# Patient Record
Sex: Female | Born: 1975 | Race: White | Hispanic: No | Marital: Single | Smoking: Current every day smoker
Health system: Southern US, Community
[De-identification: ages and names within clinical notes are randomized; demographics above are authoritative.]

## PROBLEM LIST (undated history)

## (undated) DIAGNOSIS — B1920 Unspecified viral hepatitis C without hepatic coma: Secondary | ICD-10-CM

## (undated) DIAGNOSIS — F1910 Other psychoactive substance abuse, uncomplicated: Secondary | ICD-10-CM

## (undated) DIAGNOSIS — F419 Anxiety disorder, unspecified: Secondary | ICD-10-CM

---

## 2005-01-28 ENCOUNTER — Emergency Department (HOSPITAL_COMMUNITY): Admission: EM | Admit: 2005-01-28 | Discharge: 2005-01-28 | Payer: Self-pay | Admitting: Emergency Medicine

## 2005-01-30 ENCOUNTER — Emergency Department (HOSPITAL_COMMUNITY): Admission: EM | Admit: 2005-01-30 | Discharge: 2005-01-30 | Payer: Self-pay | Admitting: Emergency Medicine

## 2005-02-12 ENCOUNTER — Emergency Department (HOSPITAL_COMMUNITY): Admission: EM | Admit: 2005-02-12 | Discharge: 2005-02-12 | Payer: Self-pay | Admitting: Emergency Medicine

## 2005-05-29 ENCOUNTER — Emergency Department (HOSPITAL_COMMUNITY): Admission: EM | Admit: 2005-05-29 | Discharge: 2005-05-29 | Payer: Self-pay | Admitting: Emergency Medicine

## 2007-03-17 ENCOUNTER — Emergency Department: Payer: Self-pay | Admitting: Emergency Medicine

## 2009-07-10 IMAGING — CT CT ABD-PELV W/ CM
1 of 2 series · 14 of 32 positions shown, 18 images · non-contrast
Comparison: none

REASON FOR EXAM: (1) RLQ  pain; (2) RLQ  pain
COMMENTS:

[Series 2: appendicitis · axial · 0.64mm/px · z∈[+151,+526]mm · 14 of 143 slices shown, 18 images]
[im 12/143  soft-tissue]
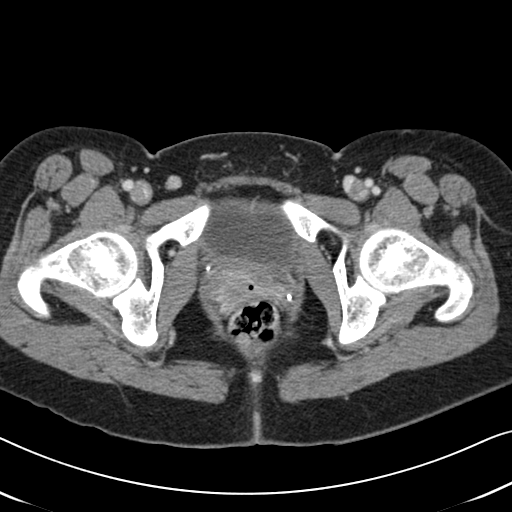
[im 12/143  bone]
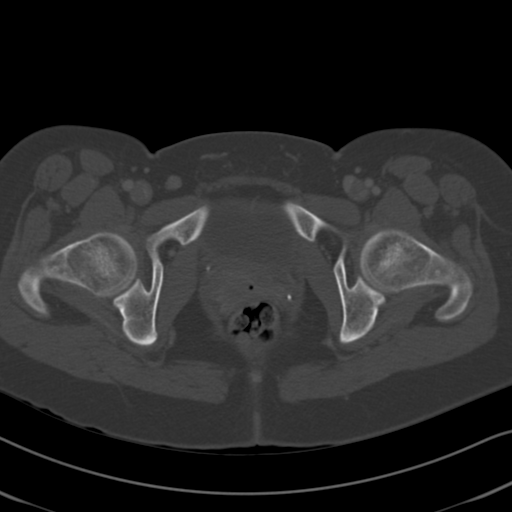
[im 23/143  soft-tissue]
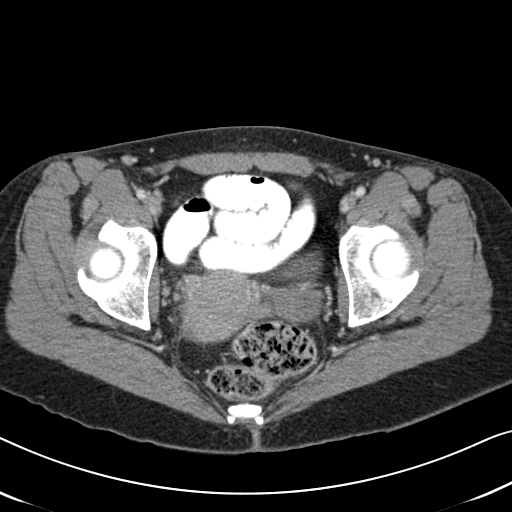
[im 35/143  soft-tissue]
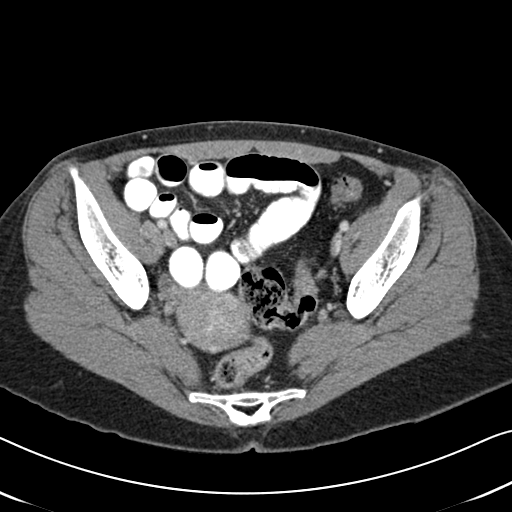
[im 46/143  soft-tissue]
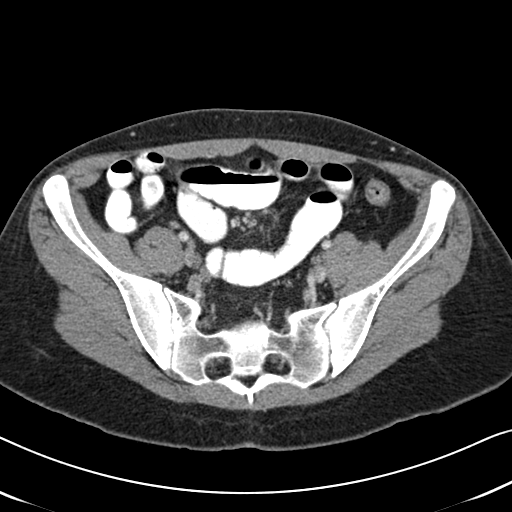
[im 57/143  soft-tissue]
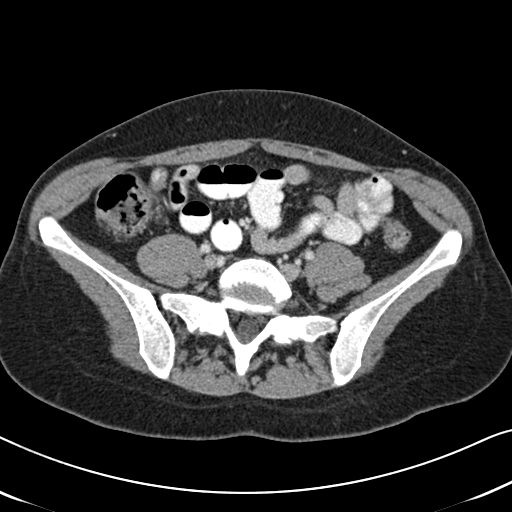
[im 69/143  soft-tissue]
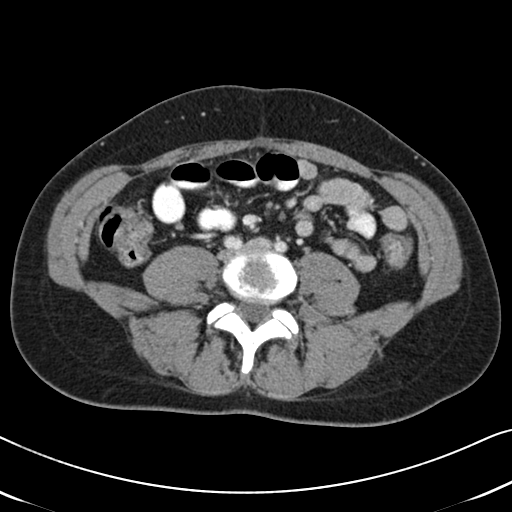
[im 80/143  soft-tissue]
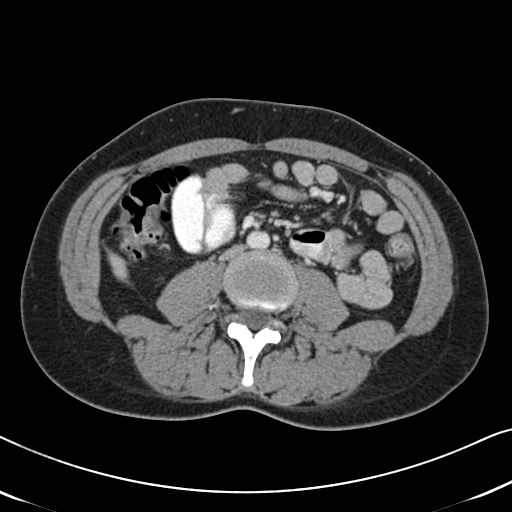
[im 91/143  soft-tissue]
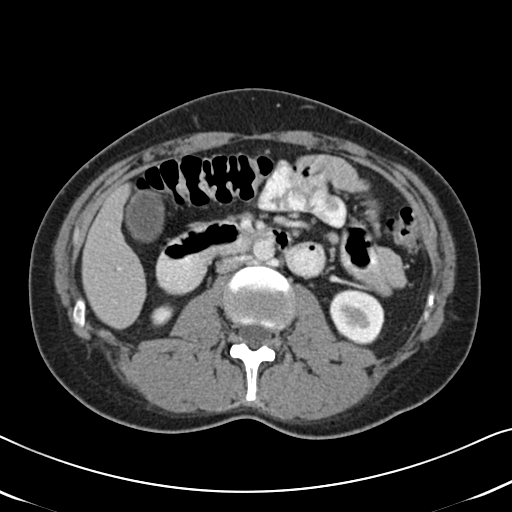
[im 103/143  soft-tissue]
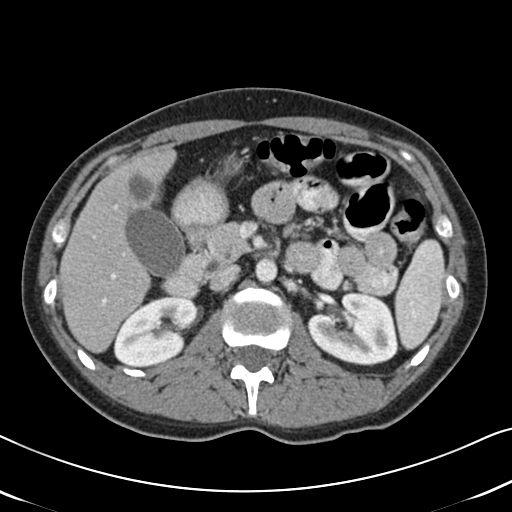
[im 103/143  bone]
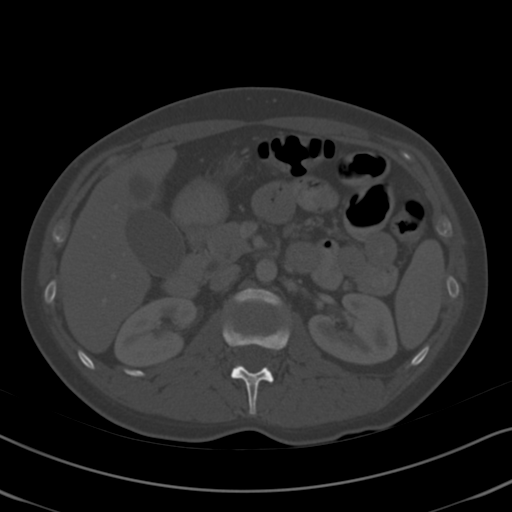
[im 114/143  soft-tissue]
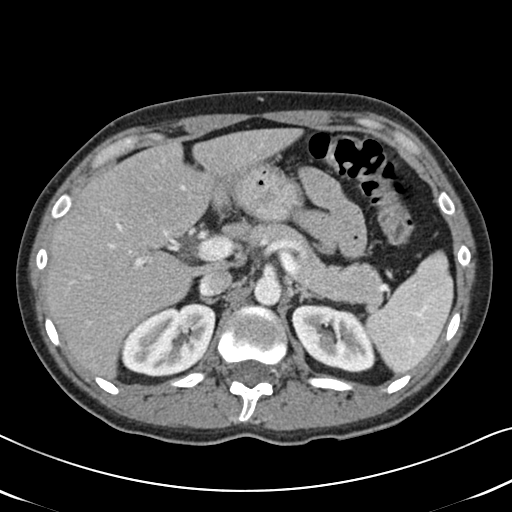
[im 120/143  lung]
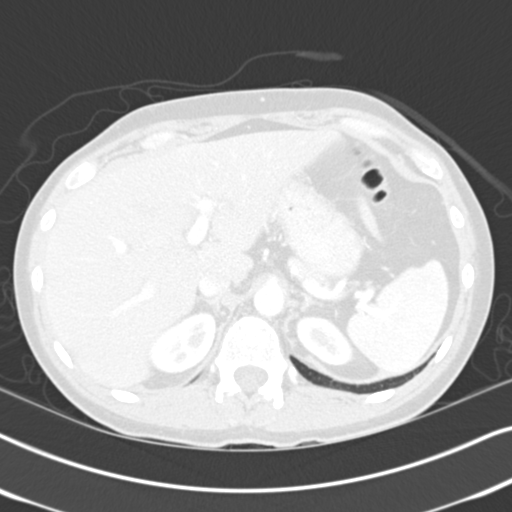
[im 125/143  soft-tissue]
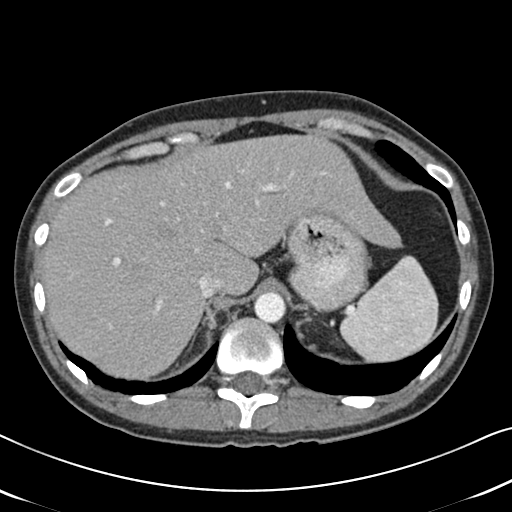
[im 125/143  lung]
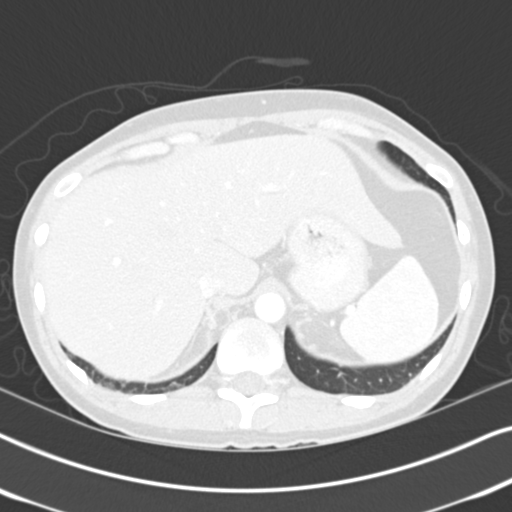
[im 131/143  lung]
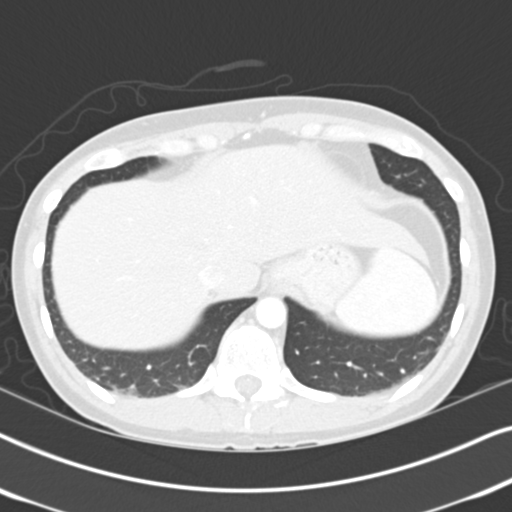
[im 137/143  soft-tissue]
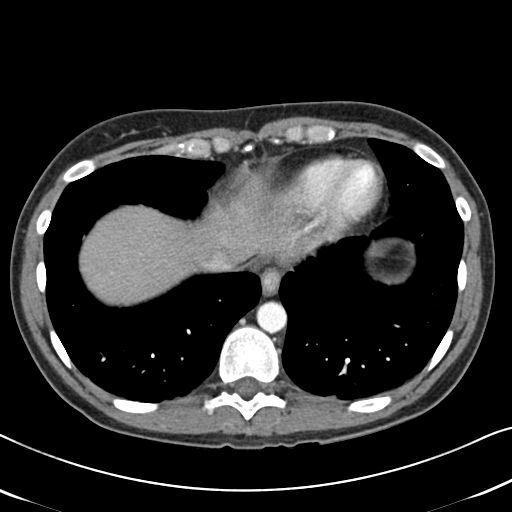
[im 137/143  lung]
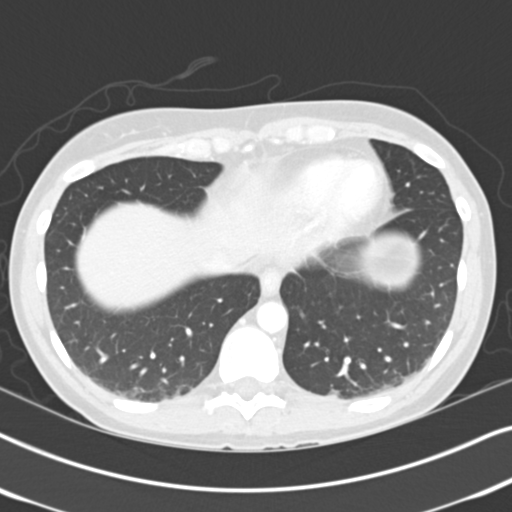

[14 of 32 positions shown; findings below may reference images not displayed]

PROCEDURE:     CT  - CT ABDOMEN / PELVIS  W  - March 17, 2007  [DATE]

RESULT:     The patient is complaining of nausea and right lower quadrant
discomfort. The patient received 100 cc of [id number] as well as oral
contrast material.

 The appendix is demonstrated and is normal in caliber. Contrast has just
reaches the region of a distal ileum. There is no free fluid in the abdomen
or within the pelvis. The uterus is heterogeneous in its enhancement and
there is a dominant fundal fibroid present. There is a  cystic adnexal
process on the left measuring approximately 3.1 x 4.2 cm. The right ovary
appears normal in size measuring roughly 1.6 cm in diameter. The sigmoid
colon contains stool and gas but does not appear inflamed. The partially
contrast filled loops of small bowel exhibit no acute abnormality. There are
numerous subcentimeter mesenteric lymph nodes present which a normal
finding. The gallbladder is adequately distended and exhibits a Phrygian cap
type contour which is a normal variant. No gallstones are identified. The
liver exhibits no ductal dilation or focal mass. There is likely a
hemangioma along the anterior/inferior aspect of the left lobe of the liver
seen best on image 44. The kidneys enhance well with no evidence of
obstruction. The spleen, pancreas, and nondistended stomach are normal in
appearance. There is no evidence of adrenal mass. The caliber of the
abdominal aorta is normal.

The lung bases exhibit no acute abnormality.
IMPRESSION: 1. The appendix is demonstrated and is normal in caliber. I do not see acute
bowel abnormality.
2. The uterus is quite heterogeneous in its echotexture and there is a
dominant exophytic fibroid from the fundus to the right of midline.
3. There is a soft tissue-fluid density complex appearing structure in the
left adnexal region which likely reflects a 4 x 3.1 cm diameter cystic
ovarian process. The right ovary appears normal and there is no evidence of
ascites.
4. The urinary bladder is normal in appearance and there is no evidence of
urinary tract obstruction.

This report was called to the [HOSPITAL] the conclusion of the
study.

## 2018-02-27 ENCOUNTER — Other Ambulatory Visit: Payer: Self-pay

## 2018-02-27 ENCOUNTER — Emergency Department
Admission: EM | Admit: 2018-02-27 | Discharge: 2018-02-27 | Disposition: A | Discharge status: Home / Self Care | Payer: BLUE CROSS/BLUE SHIELD | Source: Home / Self Care

## 2018-02-27 ENCOUNTER — Encounter: Payer: Self-pay | Admitting: Emergency Medicine

## 2018-02-27 DIAGNOSIS — Z23 Encounter for immunization: Secondary | ICD-10-CM

## 2018-02-27 DIAGNOSIS — Z8619 Personal history of other infectious and parasitic diseases: Secondary | ICD-10-CM

## 2018-02-27 DIAGNOSIS — S61212A Laceration without foreign body of right middle finger without damage to nail, initial encounter: Secondary | ICD-10-CM | POA: Diagnosis not present

## 2018-02-27 HISTORY — DX: Anxiety disorder, unspecified: F41.9

## 2018-02-27 HISTORY — DX: Other psychoactive substance abuse, uncomplicated: F19.10

## 2018-02-27 HISTORY — DX: Unspecified viral hepatitis C without hepatic coma: B19.20

## 2018-02-27 MED ORDER — TETANUS-DIPHTH-ACELL PERTUSSIS 5-2.5-18.5 LF-MCG/0.5 IM SUSP
0.5000 mL | Freq: Once | INTRAMUSCULAR | Status: AC
  Administered 2018-02-27: 0.5 mL via INTRAMUSCULAR

## 2018-02-27 NOTE — Discharge Instructions (Addendum)
Keep wound clean and dry.  Return in 10 days for suture removal.  Return sooner if problems.    You have received a tetanus vaccine today, Tdap

## 2018-02-27 NOTE — ED Provider Notes (Signed)
[facility]    CSN: [id number] Arrival date & time: 02/27/18  1316     History   Chief Complaint Chief Complaint  Patient presents with  . Finger Injury    HPI Pamela Hopkins is a 42 y.o. female.   HPI Patient cut herself at home with a clean knife across the distal part of the third finger.  Last tetanus shot was probably a decade ago.  She is healthy otherwise.  She had open day package with her sons pocketknife, and then tried to cut into the plastic without fully opening or closing the knife and it snapshot on her finger.  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 Past Medical History:  Diagnosis Date  . Anxiety   . Hepatitis C   . Substance abuse (HCC)     Works as a Child psychotherapistwaitress.  There are no active problems to display for this patient.   History reviewed. No pertinent surgical history.  OB History   No obstetric history on file.      Home Medications    Prior to Admission medications   Medication Sig Start Date End Date Taking? Authorizing Provider  buPROPion (WELLBUTRIN) 100 MG tablet Take 100 mg by mouth 2 (two) times daily.   Yes [provider]  methadone (DOLOPHINE) 5 MG tablet Take 5 mg by mouth every 8 (eight) hours.   Yes [provider]  norethindrone (MICRONOR,CAMILA,ERRIN) 0.35 MG tablet Take 1 tablet by mouth daily.   Yes [provider]    Family History No family history on file.  Social History Social History   Tobacco Use  . Smoking status: Former Games developermoker  . Smokeless tobacco: Never Used  Substance Use Topics  . Alcohol use: Not Currently  . Drug use: Not Currently     Allergies   Patient has no known allergies.   Review of Systems Review of Systems Unremarkable Physical Exam Triage Vital Signs ED Triage Vitals  Enc Vitals Group     BP      Pulse      Resp      Temp      Temp src      SpO2      Weight      Height    Head Circumference      Peak Flow      Pain Score      Pain Loc      Pain Edu?      Excl. in GC?    No data found.  Updated Vital Signs BP 132/72 (BP Location: Left Arm)   Pulse 81   Temp 97.7 F (36.5 C) (Oral)   Resp 18   Ht 5\' 10"  (1.778 m)   Wt 68 kg   LMP 02/25/2018 (Exact Date)   SpO2 100%   BMI 21.52 kg/m   Visual Acuity Right Eye Distance:   Left Eye Distance:   Bilateral Distance:    Right Eye Near:   Left Eye Near:    Bilateral Near:     Physical Exam 1.5 cm laceration along the DIP joint line of the right third finger, clean cut, not bleeding at all until explored.  It it bled a lot at home.  Wound was explored.  No tendons could be noted.  There is a tiny arteriole that pulsated that could not be located but stopped with sutures and pressure.  UC Treatments / Results  Labs (all labs ordered are listed, but only abnormal results are displayed) Labs Reviewed - No data to display  EKG None  Radiology No results found.  Procedures Procedures (including critical care time) Laceration repair: 5-0 Ethilon suture was used to repair the wound with interrupted stitches x4.  It had been anesthetized with 2% lidocaine block.  Patient tolerated well. Medications Ordered in UC Medications  Tdap (BOOSTRIX) injection 0.5 mL (0.5 mLs Intramuscular Given 02/27/18 1407)    Initial Impression / Assessment and Plan / UC Course  I have reviewed the triage vital signs and the nursing notes.  Pertinent labs & imaging results that were available during my care of the patient were reviewed by me and considered in my medical decision making (see chart for details).     Laceration finger Final Clinical Impressions(s) / UC Diagnoses   Final diagnoses:  Laceration of right middle finger without foreign body without damage to nail, initial encounter  History of hepatitis B     Discharge Instructions     Keep wound clean and dry.  Return in 10 days for suture  removal.  Return sooner if problems.      ED Prescriptions    None     Controlled Substance Prescriptions [place] Controlled Substance Registry consulted? No   Peyton NajjarHopper, Tamelia Michalowski H, MD 02/27/18 1435

## 2018-02-27 NOTE — ED Triage Notes (Signed)
Patient just lacerated knuckle of middle right finger on [employer] knife. Known Hepatitis C positive. Tetanus vacc more than 5 years ago. Very anxious; reports some nausea and mild dizziness.

## 2019-04-26 ENCOUNTER — Other Ambulatory Visit: Payer: Self-pay

## 2019-04-26 ENCOUNTER — Emergency Department
Admission: EM | Admit: 2019-04-26 | Discharge: 2019-04-26 | Disposition: A | Discharge status: Home / Self Care | Payer: BLUE CROSS/BLUE SHIELD | Source: Home / Self Care

## 2019-04-26 DIAGNOSIS — J40 Bronchitis, not specified as acute or chronic: Secondary | ICD-10-CM

## 2019-04-26 DIAGNOSIS — J020 Streptococcal pharyngitis: Secondary | ICD-10-CM

## 2019-04-26 DIAGNOSIS — J029 Acute pharyngitis, unspecified: Secondary | ICD-10-CM

## 2019-04-26 LAB — POCT RAPID STREP A (OFFICE): Rapid Strep A Screen: POSITIVE — AB

## 2019-04-26 MED ORDER — AMOXICILLIN 500 MG PO CAPS: 1000.0000 mg | ORAL_CAPSULE | Freq: Every day | ORAL | 0 refills | Status: DC

## 2019-04-26 MED ORDER — PREDNISONE 10 MG PO TABS: 40.0000 mg | ORAL_TABLET | Freq: Every day | ORAL | 0 refills | Status: AC

## 2019-04-26 MED ORDER — ALBUTEROL SULFATE HFA 108 (90 BASE) MCG/ACT IN AERS: 1.0000 | INHALATION_SPRAY | Freq: Four times a day (QID) | RESPIRATORY_TRACT | 0 refills | Status: AC | PRN

## 2019-04-26 MED ORDER — AMOXICILLIN-POT CLAVULANATE 875-125 MG PO TABS: 1.0000 | ORAL_TABLET | Freq: Two times a day (BID) | ORAL | 0 refills | Status: AC

## 2019-04-26 NOTE — ED Triage Notes (Signed)
Pt c/o sore throat x 2 days. Productive cough x 1 week. Hx of bronchitis and strep. No known covid exposure. Denies any other sxs.

## 2019-04-26 NOTE — ED Provider Notes (Signed)
[facility]    CSN: [id number] Arrival date & time: 04/26/19  1607      History   Chief Complaint Chief Complaint  Patient presents with  . Sore Throat  . Cough    HPI Pamela Hopkins is a 44 y.o. female.   44 year old female, with history of anxiety, hepatitis C, substance abuse, presenting today complaining of sore throat and cough.  Patient states that she has had a cough productive of green sputum for the past week.  States that she is a everyday smoker did not think much of the cough.  However, she states that she does typically get bronchitis each year around this time.  She says she is also had a sore throat over the past 2 days.  Has had strep throat before and this feels similar.  She has had no fever or chills.  No headache, neck pain or stiffness, chest pain or shortness of breath.  No loss of taste or smell.  No recent sick contacts or Covid exposure.  The history is provided by the patient.  Sore Throat This is a new problem. The current episode started 2 days ago. The problem occurs constantly. The problem has not changed since onset.Pertinent negatives include no chest pain, no abdominal pain, no headaches and no shortness of breath. The symptoms are aggravated by swallowing. Nothing relieves the symptoms. She has tried nothing for the symptoms. The treatment provided no relief.  Cough Cough characteristics:  Non-productive Sputum characteristics:  Green Severity:  Moderate Onset quality:  Gradual Duration:  1 week Timing:  Constant Progression:  Unchanged Chronicity:  Recurrent Smoker: yes   Context: upper respiratory infection   Context: not animal exposure, not exposure to allergens, not fumes, not occupational exposure, not weather changes and not with activity   Relieved by:  Nothing Worsened by:  Nothing Ineffective treatments:  None tried Associated symptoms: sore throat   Associated symptoms: no chest pain, no chills, no diaphoresis, no ear  fullness, no ear pain, no eye discharge, no fever, no headaches, no myalgias, no rash, no rhinorrhea, no shortness of breath, no sinus congestion, no weight loss and no wheezing   Risk factors: no chemical exposure, no recent infection and no recent travel     Past Medical History:  Diagnosis Date  . Anxiety   . Hepatitis C   . Substance abuse (HCC)     There are no problems to display for this patient.   History reviewed. No pertinent surgical history.  OB History   No obstetric history on file.      Home Medications    Prior to Admission medications   Medication Sig Start Date End Date Taking? Authorizing Provider  omeprazole (PRILOSEC) 40 MG capsule Take by mouth. 08/16/18  Yes [provider]  albuterol (VENTOLIN HFA) 108 (90 Base) MCG/ACT inhaler Inhale 1-2 puffs into the lungs every 6 (six) hours as needed for wheezing or shortness of breath. 04/26/19   Jaeden Messer C, PA-C  amoxicillin-clavulanate (AUGMENTIN) 875-125 MG tablet Take 1 tablet by mouth every 12 (twelve) hours. 04/26/19   Imad Shostak C, PA-C  buPROPion (WELLBUTRIN) 100 MG tablet Take 100 mg by mouth 2 (two) times daily.    [provider]  methadone (DOLOPHINE) 5 MG tablet Take 5 mg by mouth every 8 (eight) hours.    [provider]  norethindrone (MICRONOR,CAMILA,ERRIN) 0.35 MG tablet Take 1 tablet by mouth daily.    [provider]  predniSONE (DELTASONE) 10  MG tablet Take 4 tablets (40 mg total) by mouth daily for 5 days. 04/26/19 05/01/19  Alecia Lemming, PA-C    Family History History reviewed. No pertinent family history.  Social History Social History   Tobacco Use  . Smoking status: Current Every Day Smoker    Packs/day: 1.00  . Smokeless tobacco: Never Used  Substance Use Topics  . Alcohol use: Not Currently  . Drug use: Not Currently     Allergies   Patient has no known allergies.   Review of Systems Review of Systems  Constitutional: Negative for  chills, diaphoresis, fever and weight loss.  HENT: Positive for sore throat. Negative for ear pain and rhinorrhea.   Eyes: Negative for pain, discharge and visual disturbance.  Respiratory: Positive for cough. Negative for shortness of breath and wheezing.   Cardiovascular: Negative for chest pain and palpitations.  Gastrointestinal: Negative for abdominal pain and vomiting.  Genitourinary: Negative for dysuria and hematuria.  Musculoskeletal: Negative for arthralgias, back pain and myalgias.  Skin: Negative for color change and rash.  Neurological: Negative for seizures, syncope and headaches.  All other systems reviewed and are negative.    Physical Exam Triage Vital Signs ED Triage Vitals [04/26/19 1639]  Enc Vitals Group     BP 115/74     Pulse Rate (!) 107     Resp 18     Temp 98.8 F (37.1 C)     Temp Source Oral     SpO2 100 %     Weight 148 lb (67.1 kg)     Height 5\' 10"  (1.778 m)     Head Circumference      Peak Flow      Pain Score 0     Pain Loc      Pain Edu?      Excl. in GC?    No data found.  Updated Vital Signs BP 115/74 (BP Location: Left Arm)   Pulse (!) 107   Temp 98.8 F (37.1 C) (Oral)   Resp 18   Ht 5\' 10"  (1.778 m)   Wt 148 lb (67.1 kg)   LMP  (LMP Unknown)   SpO2 100%   BMI 21.24 kg/m   Visual Acuity Right Eye Distance:   Left Eye Distance:   Bilateral Distance:    Right Eye Near:   Left Eye Near:    Bilateral Near:     Physical Exam Vitals and nursing note reviewed.  Constitutional:      General: She is not in acute distress.    Appearance: She is well-developed.  HENT:     Head: Normocephalic and atraumatic.     Mouth/Throat:     Pharynx: Posterior oropharyngeal erythema present.  Eyes:     Conjunctiva/sclera: Conjunctivae normal.  Cardiovascular:     Rate and Rhythm: Normal rate and regular rhythm.     Heart sounds: No murmur.  Pulmonary:     Effort: Pulmonary effort is normal. No respiratory distress.     Breath  sounds: Normal breath sounds.  Abdominal:     Palpations: Abdomen is soft.     Tenderness: There is no abdominal tenderness.  Musculoskeletal:     Cervical back: Neck supple.  Skin:    General: Skin is warm and dry.  Neurological:     Mental Status: She is alert.      UC Treatments / Results  Labs (all labs ordered are listed, but only abnormal results are displayed) Labs Reviewed  POCT RAPID STREP A (OFFICE) - Abnormal; Notable for the following components:      Result Value   Rapid Strep A Screen Positive (*)    All other components within normal limits  NOVEL CORONAVIRUS, NAA    EKG   Radiology No results found.  Procedures Procedures (including critical care time)  Medications Ordered in UC Medications - No data to display  Initial Impression / Assessment and Plan / UC Course  I have reviewed the triage vital signs and the nursing notes.  Pertinent labs & imaging results that were available during my care of the patient were reviewed by me and considered in my medical decision making (see chart for details).     Cough x 1 week. Clear lung sounds.  History of recurrent bronchitis.  She is a current everyday smoker.  Send out Covid swab collected and is pending.  We will plan to treat for bronchitis with antibiotics, steroids and an inhaler. Sore throat x2 days.  Rapid strep positive.  Will treat with Augmentin to cover both strep and possible bacterial bronchitis as well as prednisone and albuterol. Final Clinical Impressions(s) / UC Diagnoses   Final diagnoses:  Acute pharyngitis, unspecified etiology  Strep pharyngitis  Bronchitis     Discharge Instructions     Strep swab + Quarantine until receiving your Covid results.  You will have to quarantine for 48 hours after starting antibiotics for your strep throat anyway.  Hopefully your swab result by that time.  Return for any worsening symptoms.    ED Prescriptions    Medication Sig Dispense Auth.  Provider   amoxicillin (AMOXIL) 500 MG capsule  (Status: Discontinued) Take 2 capsules (1,000 mg total) by mouth daily for 10 days. 20 capsule Aicha Clingenpeel C, PA-C   albuterol (VENTOLIN HFA) 108 (90 Base) MCG/ACT inhaler Inhale 1-2 puffs into the lungs every 6 (six) hours as needed for wheezing or shortness of breath. 8 g Nowell Sites C, PA-C   predniSONE (DELTASONE) 10 MG tablet Take 4 tablets (40 mg total) by mouth daily for 5 days. 20 tablet Jameia Makris C, PA-C   amoxicillin-clavulanate (AUGMENTIN) 875-125 MG tablet Take 1 tablet by mouth every 12 (twelve) hours. 14 tablet Angelena Sand C, PA-C     PDMP not reviewed this encounter.   Phebe Colla, [place] 04/26/19 1707

## 2019-04-26 NOTE — Discharge Instructions (Signed)
Strep swab + Quarantine until receiving your Covid results.  You will have to quarantine for 48 hours after starting antibiotics for your strep throat anyway.  Hopefully your swab result by that time.  Return for any worsening symptoms.

## 2019-04-27 LAB — NOVEL CORONAVIRUS, NAA: SARS-CoV-2, NAA: NOT DETECTED
